# Patient Record
Sex: Male | Born: 2000 | Hispanic: Yes | Marital: Single
Health system: Southern US, Community
[De-identification: ages and names within clinical notes are randomized; demographics above are authoritative.]

---
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
## 2021-04-16 ENCOUNTER — Other Ambulatory Visit: Payer: Self-pay | Admitting: Pediatrics

## 2021-04-16 DIAGNOSIS — N6320 Unspecified lump in the left breast, unspecified quadrant: Secondary | ICD-10-CM

## 2021-04-17 ENCOUNTER — Ambulatory Visit
Admission: RE | Admit: 2021-04-17 | Discharge: 2021-04-17 | Disposition: A | Discharge status: Home / Self Care | Payer: BC Managed Care – PPO | Source: Ambulatory Visit | Attending: Pediatrics | Admitting: Pediatrics

## 2021-04-17 ENCOUNTER — Other Ambulatory Visit: Payer: Self-pay

## 2021-04-17 DIAGNOSIS — N6320 Unspecified lump in the left breast, unspecified quadrant: Secondary | ICD-10-CM

## 2021-04-17 DIAGNOSIS — N644 Mastodynia: Secondary | ICD-10-CM | POA: Diagnosis not present

## 2021-04-17 DIAGNOSIS — N62 Hypertrophy of breast: Secondary | ICD-10-CM | POA: Diagnosis not present

## 2021-04-18 ENCOUNTER — Other Ambulatory Visit: Payer: Self-pay

## 2022-04-15 DIAGNOSIS — H66006 Acute suppurative otitis media without spontaneous rupture of ear drum, recurrent, bilateral: Secondary | ICD-10-CM | POA: Diagnosis not present

## 2023-03-31 DIAGNOSIS — Z0000 Encounter for general adult medical examination without abnormal findings: Secondary | ICD-10-CM | POA: Diagnosis not present
